# Patient Record
Sex: Female | Born: 1970 | Race: Black or African American | Hispanic: No | Marital: Single | Smoking: Current every day smoker
Health system: Southern US, Community
[De-identification: ages and names within clinical notes are randomized; demographics above are authoritative.]

## PROBLEM LIST (undated history)

## (undated) HISTORY — PX: TUBAL LIGATION: SHX77

## (undated) HISTORY — PX: OTHER SURGICAL HISTORY: SHX169

## (undated) HISTORY — PX: HEEL SPUR SURGERY: SHX665

## (undated) HISTORY — PX: CHOLECYSTECTOMY: SHX55

---

## 2019-08-10 ENCOUNTER — Other Ambulatory Visit: Payer: Self-pay

## 2019-08-10 ENCOUNTER — Ambulatory Visit
Admission: EM | Admit: 2019-08-10 | Discharge: 2019-08-10 | Disposition: A | Discharge status: Home / Self Care | Payer: BC Managed Care – PPO | Attending: Family Medicine | Admitting: Family Medicine

## 2019-08-10 DIAGNOSIS — G44209 Tension-type headache, unspecified, not intractable: Secondary | ICD-10-CM

## 2019-08-10 DIAGNOSIS — G43009 Migraine without aura, not intractable, without status migrainosus: Secondary | ICD-10-CM

## 2019-08-10 MED ORDER — RIZATRIPTAN BENZOATE 5 MG PO TBDP
5.0000 mg | ORAL_TABLET | ORAL | 0 refills | Status: AC | PRN
Start: 2019-08-10 — End: 2019-09-09

## 2019-08-10 MED ORDER — KETOROLAC TROMETHAMINE 60 MG/2ML IM SOLN
60.0000 mg | Freq: Once | INTRAMUSCULAR | Status: AC
  Administered 2019-08-10: 60 mg via INTRAMUSCULAR

## 2019-08-10 MED ORDER — CYCLOBENZAPRINE HCL 10 MG PO TABS: 10.0000 mg | ORAL_TABLET | Freq: Every day | ORAL | 0 refills | Status: AC

## 2019-08-10 NOTE — ED Provider Notes (Signed)
[facility]    CSN: [id number] Arrival date & time: 08/10/19  1710      History   Chief Complaint Chief Complaint  Patient presents with  . Migraine    HPI Meghan Brown is a 49 y.o. female.   49 yo female with a c/o migraine headaches not relieved with over the counter medications. States she has a h/o migraines for years. Has been having frequent headaches since she moved here about one month ago due to stress. States she doesn't have any rizatriptan which she used to take for her migraines. Also c/o upper back pains and tension due to the stress and has not been able sleep much recently. Denies any vision changes, numbness/tingling, fevers, chills.    Migraine    History reviewed. No pertinent past medical history.  There are no problems to display for this patient.   Past Surgical History:  Procedure Laterality Date  . CHOLECYSTECTOMY    . TUBAL LIGATION      OB History   No obstetric history on file.      Home Medications    Prior to Admission medications   Medication Sig Start Date End Date Taking? Authorizing Provider  cyclobenzaprine (FLEXERIL) 10 MG tablet Take 1 tablet (10 mg total) by mouth at bedtime. PRN 08/10/19   Norval Gable, MD  rizatriptan (MAXALT-MLT) 5 MG disintegrating tablet Take 1 tablet (5 mg total) by mouth as needed for migraine. May repeat in 2 hours if needed 08/10/19 09/09/19  Norval Gable, MD    Family History Family History  Problem Relation Age of Onset  . Hypertension Mother   . Heart attack Mother     Social History Social History   Tobacco Use  . Smoking status: Current Every Day Smoker    Packs/day: 0.50    Types: Cigarettes  . Smokeless tobacco: Never Used  Vaping Use  . Vaping Use: Never used  Substance Use Topics  . Alcohol use: Yes    Comment: rarely  . Drug use: Never     Allergies   Patient has no known allergies.   Review of Systems Review of Systems   Physical Exam Triage  Vital Signs ED Triage Vitals  Enc Vitals Group     BP 08/10/19 1741 120/74     Pulse Rate 08/10/19 1741 68     Resp 08/10/19 1741 17     Temp 08/10/19 1741 98.6 F (37 C)     Temp Source 08/10/19 1741 Oral     SpO2 08/10/19 1741 100 %     Weight 08/10/19 1738 176 lb (79.8 kg)     Height 08/10/19 1738 5' 5.5" (1.664 m)     Head Circumference --      Peak Flow --      Pain Score 08/10/19 1737 4     Pain Loc --      Pain Edu? --      Excl. in [place]? --    No data found.  Updated Vital Signs BP 120/74 (BP Location: Right Arm)   Pulse 68   Temp 98.6 F (37 C) (Oral)   Resp 17   Ht 5' 5.5" (1.664 m)   Wt 79.8 kg   LMP 07/19/2019 (Exact Date)   SpO2 100%   BMI 28.84 kg/m   Visual Acuity Right Eye Distance:   Left Eye Distance:   Bilateral Distance:    Right Eye Near:   Left Eye Near:    Bilateral Near:  Physical Exam Vitals and nursing note reviewed.  Constitutional:      General: She is not in acute distress.    Appearance: She is not toxic-appearing or diaphoretic.  Eyes:     Extraocular Movements: Extraocular movements intact.     Pupils: Pupils are equal, round, and reactive to light.  Cardiovascular:     Rate and Rhythm: Normal rate.  Musculoskeletal:     Cervical back: Normal range of motion and neck supple.  Neurological:     General: No focal deficit present.     Mental Status: She is alert and oriented to person, place, and time.     Cranial Nerves: No cranial nerve deficit.     Sensory: No sensory deficit.     Motor: No weakness.     Coordination: Coordination normal.     Gait: Gait normal.     Deep Tendon Reflexes: Reflexes normal.      UC Treatments / Results  Labs (all labs ordered are listed, but only abnormal results are displayed) Labs Reviewed - No data to display  EKG   Radiology No results found.  Procedures Procedures (including critical care time)  Medications Ordered in UC Medications  ketorolac (TORADOL) injection 60  mg (60 mg Intramuscular Given 08/10/19 1815)    Initial Impression / Assessment and Plan / UC Course  I have reviewed the triage vital signs and the nursing notes.  Pertinent labs & imaging results that were available during my care of the patient were reviewed by me and considered in my medical decision making (see chart for details).      Final Clinical Impressions(s) / UC Diagnoses   Final diagnoses:  Migraine without aura and without status migrainosus, not intractable  Muscle tension headache     Discharge Instructions     Follow up with Primary Care provider    ED Prescriptions    Medication Sig Dispense Auth. Provider   rizatriptan (MAXALT-MLT) 5 MG disintegrating tablet Take 1 tablet (5 mg total) by mouth as needed for migraine. May repeat in 2 hours if needed 9 tablet Meryle Pugmire, Pamala Hurry, MD   cyclobenzaprine (FLEXERIL) 10 MG tablet Take 1 tablet (10 mg total) by mouth at bedtime. PRN 30 tablet Payton Mccallum, MD      1. diagnosis reviewed with patient; given toradol 60mg  IM x 1 2. rx as per orders above; reviewed possible side effects, interactions, risks and benefits 3. Establish care and follow up with PCP 4. Follow-up prn if symptoms worsen or don't improve   PDMP not reviewed this encounter.   , MD 08/10/19 1820

## 2019-08-10 NOTE — ED Triage Notes (Signed)
Patient complains of migraines that started happening recently. States that she has noticed her neck muscles tightening, Reports that she is under a new amount stress, recently moving and starting a new job.   Patient states that she has not been sleeping well recently which is causing her to feel irritable.

## 2019-08-10 NOTE — Discharge Instructions (Signed)
Follow up with Primary Care provider 

## 2019-11-24 ENCOUNTER — Ambulatory Visit
Admission: EM | Admit: 2019-11-24 | Discharge: 2019-11-24 | Disposition: A | Discharge status: Home / Self Care | Payer: BC Managed Care – PPO | Attending: Physician Assistant | Admitting: Physician Assistant

## 2019-11-24 ENCOUNTER — Encounter: Payer: Self-pay | Admitting: Emergency Medicine

## 2019-11-24 ENCOUNTER — Other Ambulatory Visit: Payer: Self-pay

## 2019-11-24 DIAGNOSIS — R350 Frequency of micturition: Secondary | ICD-10-CM | POA: Diagnosis present

## 2019-11-24 DIAGNOSIS — B9689 Other specified bacterial agents as the cause of diseases classified elsewhere: Secondary | ICD-10-CM | POA: Diagnosis present

## 2019-11-24 DIAGNOSIS — M545 Low back pain, unspecified: Secondary | ICD-10-CM

## 2019-11-24 DIAGNOSIS — N760 Acute vaginitis: Secondary | ICD-10-CM | POA: Insufficient documentation

## 2019-11-24 LAB — URINALYSIS, COMPLETE (UACMP) WITH MICROSCOPIC
Bacteria, UA: NONE SEEN
Bilirubin Urine: NEGATIVE
Glucose, UA: NEGATIVE mg/dL
Hgb urine dipstick: NEGATIVE
Ketones, ur: NEGATIVE mg/dL
Leukocytes,Ua: NEGATIVE
Nitrite: NEGATIVE
Protein, ur: NEGATIVE mg/dL
RBC / HPF: NONE SEEN RBC/hpf (ref 0–5)
Specific Gravity, Urine: 1.015 (ref 1.005–1.030)
WBC, UA: NONE SEEN WBC/hpf (ref 0–5)
pH: 7 (ref 5.0–8.0)

## 2019-11-24 LAB — WET PREP, GENITAL
Sperm: NONE SEEN
Trich, Wet Prep: NONE SEEN
WBC, Wet Prep HPF POC: NONE SEEN
Yeast Wet Prep HPF POC: NONE SEEN

## 2019-11-24 MED ORDER — HYDROCODONE-ACETAMINOPHEN 5-325 MG PO TABS: 1.0000 | ORAL_TABLET | Freq: Four times a day (QID) | ORAL | 0 refills | Status: AC | PRN

## 2019-11-24 MED ORDER — DICLOFENAC SODIUM 75 MG PO TBEC: 75.0000 mg | DELAYED_RELEASE_TABLET | Freq: Two times a day (BID) | ORAL | 0 refills | Status: AC

## 2019-11-24 MED ORDER — KETOROLAC TROMETHAMINE 60 MG/2ML IM SOLN
60.0000 mg | Freq: Once | INTRAMUSCULAR | Status: AC
  Administered 2019-11-24: 60 mg via INTRAMUSCULAR

## 2019-11-24 MED ORDER — METRONIDAZOLE 500 MG PO TABS
500.0000 mg | ORAL_TABLET | Freq: Two times a day (BID) | ORAL | 0 refills | Status: AC
Start: 2019-11-24 — End: 2019-12-01

## 2019-11-24 MED ORDER — CHLORZOXAZONE 500 MG PO TABS: 500.0000 mg | ORAL_TABLET | Freq: Three times a day (TID) | ORAL | 0 refills | Status: AC | PRN

## 2019-11-24 NOTE — Discharge Instructions (Addendum)
BACK PAIN: Stressed avoiding painful activities . RICE (REST, ICE, COMPRESSION, ELEVATION) guidelines reviewed. May alternate ice and heat. Consider use of muscle rubs, Salonpas patches, etc. Use medications as directed including muscle relaxers if prescribed. Take anti-inflammatory medications as prescribed or OTC NSAIDs/Tylenol.  F/u with PCP in 7-10 days for reexamination, and please feel free to call or return to the urgent care at any time for any questions or concerns you may have and we will be happy to help you!   BACK PAIN RED FLAGS: If the back pain acutely worsens or there are any red flag symptoms such as numbness/tingling, leg weakness, saddle anesthesia, or loss of bowel/bladder control, go immediately to the ER. Follow up with [place] as scheduled or sooner if the pain does not begin to resolve or if it worsens before the follow up    The urinalysis was normal today.  We will send the urine for culture.  We will treat for UTI if urine culture is positive.  Treating at this time for BV infection based on the vaginal swab obtained.  Use pH balance washes and wipes.

## 2019-11-24 NOTE — ED Provider Notes (Signed)
[facility]    CSN: [id number] Arrival date & time: 11/24/19  1155      History   Chief Complaint Chief Complaint  Patient presents with   Back Pain   Urinary Frequency    HPI Meghan Brown is a 49 y.o. female sending for left lower back pain, urinary frequency and dark urine for the past week that has been worsening over the past day.  She says that a month ago she had a virtual visit for urinary frequency and urgency without back pain and was prescribed antibiotic for suspected UTI.  She said that resolved her symptoms.  Patient denies any fever, fatigue, chills, sweats, abdominal pain, pelvic pain, nausea, vomiting, diarrhea, vaginal discharge, hematuria.  Has taken ibuprofen 800 mg without relief of pain.  Denies any injury to back.  He says that bending forward does make the back pain worse.  Denies any radiation pain to the legs.  Denies any numbness, weakness, tingling.  Last menstrual period 2 weeks ago.  No concern for STIs.  No other concerns today.  HPI  History reviewed. No pertinent past medical history.  There are no problems to display for this patient.   Past Surgical History:  Procedure Laterality Date   CHOLECYSTECTOMY     HEEL SPUR SURGERY     thumb surgery     TUBAL LIGATION      OB History   No obstetric history on file.      Home Medications    Prior to Admission medications   Medication Sig Start Date End Date Taking? Authorizing Provider  chlorzoxazone (PARAFON) 500 MG tablet Take 1 tablet (500 mg total) by mouth 3 (three) times daily as needed for up to 7 days for muscle spasms. 11/24/19 12/01/19  Eusebio Friendly B, PA-C  cyclobenzaprine (FLEXERIL) 10 MG tablet Take 1 tablet (10 mg total) by mouth at bedtime. PRN 08/10/19   Payton Mccallum, MD  diclofenac (VOLTAREN) 75 MG EC tablet Take 1 tablet (75 mg total) by mouth 2 (two) times daily for 10 days. 11/24/19 12/04/19  Shirlee Latch, PA-C  HYDROcodone-acetaminophen (NORCO/VICODIN)  5-325 MG tablet Take 1 tablet by mouth every 6 (six) hours as needed for up to 3 days. 11/24/19 11/27/19  Eusebio Friendly B, PA-C  metroNIDAZOLE (FLAGYL) 500 MG tablet Take 1 tablet (500 mg total) by mouth 2 (two) times daily for 7 days. 11/24/19 12/01/19  Shirlee Latch, PA-C    Family History Family History  Problem Relation Age of Onset   Hypertension Mother    Heart attack Mother    Pleurisy Father     Social History Social History   Tobacco Use   Smoking status: Current Every Day Smoker    Packs/day: 0.50    Years: 20.00    Pack years: 10.00    Types: Cigarettes   Smokeless tobacco: Never Used  Vaping Use   Vaping Use: Never used  Substance Use Topics   Alcohol use: Yes    Comment: rarely   Drug use: Never     Allergies   Patient has no known allergies.   Review of Systems Review of Systems  Constitutional: Negative for chills, diaphoresis, fatigue and fever.  Eyes: Negative for redness.  Respiratory: Negative for cough and shortness of breath.   Cardiovascular: Negative for chest pain.  Gastrointestinal: Negative for abdominal pain, nausea and vomiting.  Genitourinary: Positive for frequency, urgency and vaginal discharge. Negative for difficulty urinating, dysuria, flank pain, hematuria, pelvic pain,  vaginal bleeding and vaginal pain.       Dark colored urine  Musculoskeletal: Positive for back pain.  Skin: Negative for rash.  Neurological: Negative for weakness.     Physical Exam Triage Vital Signs ED Triage Vitals  Enc Vitals Group     BP 11/24/19 1257 126/82     Pulse Rate 11/24/19 1257 79     Resp 11/24/19 1257 18     Temp 11/24/19 1257 98.8 F (37.1 C)     Temp Source 11/24/19 1257 Oral     SpO2 11/24/19 1257 100 %     Weight 11/24/19 1257 165 lb (74.8 kg)     Height 11/24/19 1257 5' 5.5" (1.664 m)     Head Circumference --      Peak Flow --      Pain Score 11/24/19 1256 9     Pain Loc --      Pain Edu? --      Excl. in [place]? --    No  data found.  Updated Vital Signs BP 126/82 (BP Location: Left Arm)    Pulse 79    Temp 98.8 F (37.1 C) (Oral)    Resp 18    Ht 5' 5.5" (1.664 m)    Wt 165 lb (74.8 kg)    LMP 11/10/2019 (Approximate)    SpO2 100%    BMI 27.04 kg/m    Physical Exam Vitals and nursing note reviewed.  Constitutional:      General: She is not in acute distress.    Appearance: Normal appearance. She is not ill-appearing or toxic-appearing.  HENT:     Head: Normocephalic and atraumatic.  Eyes:     General: No scleral icterus.       Right eye: No discharge.        Left eye: No discharge.     Conjunctiva/sclera: Conjunctivae normal.  Cardiovascular:     Rate and Rhythm: Normal rate and regular rhythm.     Heart sounds: Normal heart sounds.  Pulmonary:     Effort: Pulmonary effort is normal. No respiratory distress.     Breath sounds: Normal breath sounds.  Abdominal:     Palpations: Abdomen is soft.     Tenderness: There is abdominal tenderness (mild LLQ). There is left CVA tenderness (mild). There is no guarding.  Musculoskeletal:     Cervical back: Neck supple.  Skin:    General: Skin is dry.  Neurological:     General: No focal deficit present.     Mental Status: She is alert. Mental status is at baseline.     Motor: No weakness.     Gait: Gait normal.  Psychiatric:        Mood and Affect: Mood normal.        Behavior: Behavior normal.        Thought Content: Thought content normal.      UC Treatments / Results  Labs (all labs ordered are listed, but only abnormal results are displayed) Labs Reviewed  WET PREP, GENITAL - Abnormal; Notable for the following components:      Result Value   Clue Cells Wet Prep HPF POC PRESENT (*)    All other components within normal limits  URINE CULTURE  URINALYSIS, COMPLETE (UACMP) WITH MICROSCOPIC    EKG   Radiology No results found.  Procedures Procedures (including critical care time)  Medications Ordered in UC Medications    ketorolac (TORADOL) injection 60 mg (60 mg Intramuscular Given  11/24/19 1411)    Initial Impression / Assessment and Plan / UC Course  I have reviewed the triage vital signs and the nursing notes.  Pertinent labs & imaging results that were available during my care of the patient were reviewed by me and considered in my medical decision making (see chart for details).   UA within normal limits.  Wet prep shows positive clue cells.  Treating at this time for bacterial vaginosis infection with metronidazole.  Patient is back pain seems more musculoskeletal so treating that at this time with diclofenac, chlorzoxazone, and hydrocodone as needed for breakthrough pain.  Urine sent for culture and will treat for UTI if positive.  ED precautions discussed with patient.   Final Clinical Impressions(s) / UC Diagnoses   Final diagnoses:  Acute right-sided low back pain without sciatica  Bacterial vaginosis  Urinary frequency     Discharge Instructions     BACK PAIN: Stressed avoiding painful activities . RICE (REST, ICE, COMPRESSION, ELEVATION) guidelines reviewed. May alternate ice and heat. Consider use of muscle rubs, Salonpas patches, etc. Use medications as directed including muscle relaxers if prescribed. Take anti-inflammatory medications as prescribed or OTC NSAIDs/Tylenol.  F/u with PCP in 7-10 days for reexamination, and please feel free to call or return to the urgent care at any time for any questions or concerns you may have and we will be happy to help you!   BACK PAIN RED FLAGS: If the back pain acutely worsens or there are any red flag symptoms such as numbness/tingling, leg weakness, saddle anesthesia, or loss of bowel/bladder control, go immediately to the ER. Follow up with [place] as scheduled or sooner if the pain does not begin to resolve or if it worsens before the follow up    The urinalysis was normal today.  We will send the urine for culture.  We will treat for UTI if urine  culture is positive.  Treating at this time for BV infection based on the vaginal swab obtained.  Use pH balance washes and wipes.    ED Prescriptions    Medication Sig Dispense Auth. Provider   metroNIDAZOLE (FLAGYL) 500 MG tablet Take 1 tablet (500 mg total) by mouth 2 (two) times daily for 7 days. 14 tablet Eusebio Friendly B, PA-C   chlorzoxazone (PARAFON) 500 MG tablet Take 1 tablet (500 mg total) by mouth 3 (three) times daily as needed for up to 7 days for muscle spasms. 21 tablet Eusebio Friendly B, PA-C   diclofenac (VOLTAREN) 75 MG EC tablet Take 1 tablet (75 mg total) by mouth 2 (two) times daily for 10 days. 20 tablet Shirlee Latch, PA-C   HYDROcodone-acetaminophen (NORCO/VICODIN) 5-325 MG tablet Take 1 tablet by mouth every 6 (six) hours as needed for up to 3 days. 8 tablet Shirlee Latch, PA-C     I have reviewed the PDMP during this encounter.   Shirlee Latch, PA-C 11/25/19 1416

## 2019-11-24 NOTE — ED Triage Notes (Signed)
Patient in today c/o low back pain, urinary frequency and dark urine x >1 week. Patient did a virtual visit ~1 month ago and was prescribed antibiotics. Patient did complete the antibiotics and symptoms did resolve.

## 2019-11-26 ENCOUNTER — Telehealth (HOSPITAL_COMMUNITY): Payer: Self-pay | Admitting: Emergency Medicine

## 2019-11-26 LAB — URINE CULTURE: Culture: 80000 — AB

## 2019-11-26 MED ORDER — SULFAMETHOXAZOLE-TRIMETHOPRIM 800-160 MG PO TABS
1.0000 | ORAL_TABLET | Freq: Two times a day (BID) | ORAL | 0 refills | Status: AC
Start: 2019-11-26 — End: 2019-12-03

## 2020-02-24 ENCOUNTER — Other Ambulatory Visit: Payer: Self-pay

## 2020-02-24 ENCOUNTER — Ambulatory Visit (INDEPENDENT_AMBULATORY_CARE_PROVIDER_SITE_OTHER): Payer: BC Managed Care – PPO

## 2020-02-24 ENCOUNTER — Ambulatory Visit
Admission: EM | Admit: 2020-02-24 | Discharge: 2020-02-24 | Disposition: A | Discharge status: Home / Self Care | Payer: BC Managed Care – PPO | Attending: Family Medicine | Admitting: Family Medicine

## 2020-02-24 DIAGNOSIS — R0989 Other specified symptoms and signs involving the circulatory and respiratory systems: Secondary | ICD-10-CM | POA: Diagnosis not present

## 2020-02-24 DIAGNOSIS — R0602 Shortness of breath: Secondary | ICD-10-CM | POA: Diagnosis present

## 2020-02-24 DIAGNOSIS — U071 COVID-19: Secondary | ICD-10-CM

## 2020-02-24 DIAGNOSIS — R059 Cough, unspecified: Secondary | ICD-10-CM | POA: Diagnosis present

## 2020-02-24 LAB — RESP PANEL BY RT-PCR (FLU A&B, COVID) ARPGX2
Influenza A by PCR: NEGATIVE
Influenza B by PCR: NEGATIVE
SARS Coronavirus 2 by RT PCR: POSITIVE — AB

## 2020-02-24 MED ORDER — ALBUTEROL SULFATE HFA 108 (90 BASE) MCG/ACT IN AERS: 1.0000 | INHALATION_SPRAY | Freq: Four times a day (QID) | RESPIRATORY_TRACT | 0 refills | Status: AC | PRN

## 2020-02-24 MED ORDER — BENZONATATE 100 MG PO CAPS: 200.0000 mg | ORAL_CAPSULE | Freq: Three times a day (TID) | ORAL | 0 refills | Status: AC | PRN

## 2020-02-24 NOTE — ED Provider Notes (Signed)
[facility]    CSN: [id number] Arrival date & time: 02/24/20  1218      History   Chief Complaint Chief Complaint  Patient presents with   Cough    HPI Meghan Brown is a 49 y.o. female presenting for 2 day history of cough and chest congestion. Patient says symptoms feel similar to when she had pneumonia in the past.  Patient denies fever, said she felt hot.  She denies any sore throat.  Has had some headaches.  Denies any significant fatigue.  She states her chest hurts sometimes when she coughs and feels short of breath.  He says that she feels more short of breath when she is walking.  Patient says her daughter has similar symptoms.  Patient denies any known COVID-19 exposure. Patient not vaccinated for COVID 19. Has been drinking warm teas with honey, but no OTC meds.  Patient without any significant medical problems.  No other complaints or concerns.  HPI  History reviewed. No pertinent past medical history.  There are no problems to display for this patient.   Past Surgical History:  Procedure Laterality Date   CHOLECYSTECTOMY     HEEL SPUR SURGERY     thumb surgery     TUBAL LIGATION      OB History   No obstetric history on file.      Home Medications    Prior to Admission medications   Medication Sig Start Date End Date Taking? Authorizing Provider  albuterol (VENTOLIN HFA) 108 (90 Base) MCG/ACT inhaler Inhale 1-2 puffs into the lungs every 6 (six) hours as needed for wheezing or shortness of breath. 02/24/20 02/23/21 Yes Shirlee Latch, PA-C  benzonatate (TESSALON) 100 MG capsule Take 2 capsules (200 mg total) by mouth 3 (three) times daily as needed for cough. 02/24/20  Yes Eusebio Friendly B, PA-C  cyclobenzaprine (FLEXERIL) 10 MG tablet Take 1 tablet (10 mg total) by mouth at bedtime. PRN 08/10/19   Payton Mccallum, MD    Family History Family History  Problem Relation Age of Onset   Hypertension Mother    Heart attack Mother     Pleurisy Father     Social History Social History   Tobacco Use   Smoking status: Current Every Day Smoker    Packs/day: 0.50    Years: 20.00    Pack years: 10.00    Types: Cigarettes   Smokeless tobacco: Never Used  Building services engineer Use: Never used  Substance Use Topics   Alcohol use: Yes    Comment: rarely   Drug use: Never     Allergies   Patient has no known allergies.   Review of Systems Review of Systems  Constitutional: Negative for chills, diaphoresis, fatigue and fever.  HENT: Positive for congestion and rhinorrhea. Negative for ear pain, sinus pressure, sinus pain and sore throat.   Respiratory: Positive for cough and shortness of breath.   Cardiovascular: Positive for chest pain.  Gastrointestinal: Negative for abdominal pain, nausea and vomiting.  Musculoskeletal: Positive for myalgias. Negative for arthralgias.  Skin: Negative for rash.  Neurological: Negative for weakness and headaches.  Hematological: Negative for adenopathy.     Physical Exam Triage Vital Signs ED Triage Vitals  Enc Vitals Group     BP 02/24/20 1531 105/77     Pulse Rate 02/24/20 1531 74     Resp 02/24/20 1531 18     Temp 02/24/20 1531 98.2 F (36.8 C)  Temp Source 02/24/20 1531 Oral     SpO2 02/24/20 1531 100 %     Weight 02/24/20 1532 170 lb (77.1 kg)     Height 02/24/20 1532 5' 5.5" (1.664 m)     Head Circumference --      Peak Flow --      Pain Score 02/24/20 1512 2     Pain Loc --      Pain Edu? --      Excl. in GC? --    No data found.  Updated Vital Signs BP 105/77 (BP Location: Left Arm)    Pulse 74    Temp 98.2 F (36.8 C) (Oral)    Resp 18    Ht 5' 5.5" (1.664 m)    Wt 170 lb (77.1 kg)    LMP 02/01/2020    SpO2 100%    BMI 27.86 kg/m       Physical Exam Vitals and nursing note reviewed.  Constitutional:      General: She is not in acute distress.    Appearance: Normal appearance. She is not ill-appearing or toxic-appearing.  HENT:      Head: Normocephalic and atraumatic.     Nose: Congestion and rhinorrhea present.     Mouth/Throat:     Mouth: Mucous membranes are moist.     Pharynx: Oropharynx is clear.  Eyes:     General: No scleral icterus.       Right eye: No discharge.        Left eye: No discharge.     Conjunctiva/sclera: Conjunctivae normal.  Cardiovascular:     Rate and Rhythm: Normal rate and regular rhythm.     Heart sounds: Normal heart sounds.  Pulmonary:     Effort: Pulmonary effort is normal. No respiratory distress.     Breath sounds: Normal breath sounds. No wheezing, rhonchi or rales.  Musculoskeletal:     Cervical back: Neck supple.  Skin:    General: Skin is dry.  Neurological:     General: No focal deficit present.     Mental Status: She is alert. Mental status is at baseline.     Motor: No weakness.     Gait: Gait normal.  Psychiatric:        Mood and Affect: Mood normal.        Behavior: Behavior normal.        Thought Content: Thought content normal.      UC Treatments / Results  Labs (all labs ordered are listed, but only abnormal results are displayed) Labs Reviewed  RESP PANEL BY RT-PCR (FLU A&B, COVID) ARPGX2 - Abnormal; Notable for the following components:      Result Value   SARS Coronavirus 2 by RT PCR POSITIVE (*)    All other components within normal limits    EKG   Radiology DG Chest 2 View  Result Date: 02/24/2020 CLINICAL DATA:  Cough, chest congestion for 2 days, COVID-19 positive today EXAM: CHEST - 2 VIEW COMPARISON:  None. FINDINGS: The heart size and mediastinal contours are within normal limits. Both lungs are clear. The visualized skeletal structures are unremarkable. IMPRESSION: No active cardiopulmonary disease. Electronically Signed   By: Sharlet Salina M.D.   On: 02/24/2020 16:30    Procedures Procedures (including critical care time)  Medications Ordered in UC Medications - No data to display  Initial Impression / Assessment and Plan / UC  Course  I have reviewed the triage vital signs and the nursing notes.  Pertinent labs & imaging results that were available during my care of the patient were reviewed by me and considered in my medical decision making (see chart for details).   49 year old female presenting for cough, congestion, body aches, chest discomfort and breathing difficulty over the past 2 days.  Patient is unvaccinated.  All vital signs are normal and stable and patient is in no acute distress.  She has mild nasal congestion rhinorrhea on exam.  Chest is clear to auscultation heart regular rate and rhythm.  Respiratory panel obtained today.  Chest x-ray obtained due to patient concerns about possible pneumonia even though I advised her that it is unlikely since she does not have a fever and her oxygen is normal.  Also her chest is clear to auscultation.  Chest x-ray which was independently read by me is within normal limits.  Respiratory panel positive for COVID-19.  Discussed results with patient.  CDC guidelines, isolation protocol and ED precautions reviewed with patient.  Advised supportive care with increasing rest and fluids.  Sent LawyerTessalon Perles to pharmacy.  Contacted infusion center for possible MAB therapy since patient is unvaccinated.  Final Clinical Impressions(s) / UC Diagnoses   Final diagnoses:  COVID-19  Cough  Shortness of breath     Discharge Instructions     +COVID test today. 8 more days of isolation.   If symptomatic, go home and rest. Push fluids. Take Tylenol as needed for discomfort. Gargle warm salt water. Throat lozenges. Take Mucinex DM or Robitussin for cough. Humidifier in bedroom to ease coughing. Warm showers. Also review the COVID handout for more information.  COVID-19 INFECTION: The incubation period of COVID-19 is approximately 14 days after exposure, with most symptoms developing in roughly 4-5 days. Symptoms may range in severity from mild to critically severe. Roughly  80% of those infected will have mild symptoms. People of any age may become infected with COVID-19 and have the ability to transmit the virus. The most common symptoms include: fever, fatigue, cough, body aches, headaches, sore throat, nasal congestion, shortness of breath, nausea, vomiting, diarrhea, changes in smell and/or taste.    COURSE OF ILLNESS Some patients may begin with mild disease which can progress quickly into critical symptoms. If your symptoms are worsening please call ahead to the Emergency Department and proceed there for further treatment. Recovery time appears to be roughly 1-2 weeks for mild symptoms and 3-6 weeks for severe disease.   GO IMMEDIATELY TO ER FOR FEVER YOU ARE UNABLE TO GET DOWN WITH TYLENOL, BREATHING PROBLEMS, CHEST PAIN, FATIGUE, LETHARGY, INABILITY TO EAT OR DRINK, ETC  QUARANTINE AND ISOLATION: To help decrease the spread of COVID-19 please remain isolated if you have COVID infection or are highly suspected to have COVID infection. This means -stay home and isolate to one room in the home if you live with others. Do not share a bed or bathroom with others while ill, sanitize and wipe down all countertops and keep common areas clean and disinfected. You may discontinue isolation if you have a mild case and are asymptomatic 10 days after symptom onset as long as you have been fever free >24 hours without having to take Motrin or Tylenol. If your case is more severe (meaning you develop pneumonia or are admitted in the hospital), you may have to isolate longer.   If you have been in close contact (within 6 feet) of someone diagnosed with COVID 19, you are advised to quarantine in your home for 14 days as  symptoms can develop anywhere from 2-14 days after exposure to the virus. If you develop symptoms, you  must isolate.  Most current guidelines for COVID after exposure -isolate 10 days if you ARE NOT tested for COVID as long as symptoms do not develop -isolate 7 days  if you are tested and remain asymptomatic -You do not necessarily need to be tested for COVID if you have + exposure and        develop   symptoms. Just isolate at home x10 days from symptom onset During this global pandemic, CDC advises to practice social distancing, try to stay at least 17ft away from others at all times. Wear a face covering. Wash and sanitize your hands regularly and avoid going anywhere that is not necessary.  KEEP IN MIND THAT THE COVID TEST IS NOT 100% ACCURATE AND YOU SHOULD STILL DO EVERYTHING TO PREVENT POTENTIAL SPREAD OF VIRUS TO OTHERS (WEAR MASK, WEAR GLOVES, WASH HANDS AND SANITIZE REGULARLY). IF INITIAL TEST IS NEGATIVE, THIS MAY NOT MEAN YOU ARE DEFINITELY NEGATIVE. MOST ACCURATE TESTING IS DONE 5-7 DAYS AFTER EXPOSURE.   It is not advised by CDC to get re-tested after receiving a positive COVID test since you can still test positive for weeks to months after you have already cleared the virus.   *If you have not been vaccinated for COVID, I strongly suggest you consider getting vaccinated as long as there are no contraindications.      ED Prescriptions    Medication Sig Dispense Auth. Provider   benzonatate (TESSALON) 100 MG capsule Take 2 capsules (200 mg total) by mouth 3 (three) times daily as needed for cough. 21 capsule Eusebio Friendly B, PA-C   albuterol (VENTOLIN HFA) 108 (90 Base) MCG/ACT inhaler Inhale 1-2 puffs into the lungs every 6 (six) hours as needed for wheezing or shortness of breath. 1 each Gareth Morgan     PDMP not reviewed this encounter.   Shirlee Latch, PA-C 02/24/20 1709

## 2020-02-24 NOTE — ED Triage Notes (Signed)
Cough, chest congestion x 2 days. Afebrile but feels hot.  Just hot tea and lemon to treat cough.  Had COVID test 12/24 that was negative.

## 2020-02-24 NOTE — Discharge Instructions (Addendum)
+COVID test today. 8 more days of isolation.   If symptomatic, go home and rest. Push fluids. Take Tylenol as needed for discomfort. Gargle warm salt water. Throat lozenges. Take Mucinex DM or Robitussin for cough. Humidifier in bedroom to ease coughing. Warm showers. Also review the COVID handout for more information.  COVID-19 INFECTION: The incubation period of COVID-19 is approximately 14 days after exposure, with most symptoms developing in roughly 4-5 days. Symptoms may range in severity from mild to critically severe. Roughly 80% of those infected will have mild symptoms. People of any age may become infected with COVID-19 and have the ability to transmit the virus. The most common symptoms include: fever, fatigue, cough, body aches, headaches, sore throat, nasal congestion, shortness of breath, nausea, vomiting, diarrhea, changes in smell and/or taste.    COURSE OF ILLNESS Some patients may begin with mild disease which can progress quickly into critical symptoms. If your symptoms are worsening please call ahead to the Emergency Department and proceed there for further treatment. Recovery time appears to be roughly 1-2 weeks for mild symptoms and 3-6 weeks for severe disease.   GO IMMEDIATELY TO ER FOR FEVER YOU ARE UNABLE TO GET DOWN WITH TYLENOL, BREATHING PROBLEMS, CHEST PAIN, FATIGUE, LETHARGY, INABILITY TO EAT OR DRINK, ETC  QUARANTINE AND ISOLATION: To help decrease the spread of COVID-19 please remain isolated if you have COVID infection or are highly suspected to have COVID infection. This means -stay home and isolate to one room in the home if you live with others. Do not share a bed or bathroom with others while ill, sanitize and wipe down all countertops and keep common areas clean and disinfected. You may discontinue isolation if you have a mild case and are asymptomatic 10 days after symptom onset as long as you have been fever free >24 hours without having to take Motrin or Tylenol.  If your case is more severe (meaning you develop pneumonia or are admitted in the hospital), you may have to isolate longer.   If you have been in close contact (within 6 feet) of someone diagnosed with COVID 19, you are advised to quarantine in your home for 14 days as symptoms can develop anywhere from 2-14 days after exposure to the virus. If you develop symptoms, you  must isolate.  Most current guidelines for COVID after exposure -isolate 10 days if you ARE NOT tested for COVID as long as symptoms do not develop -isolate 7 days if you are tested and remain asymptomatic -You do not necessarily need to be tested for COVID if you have + exposure and        develop   symptoms. Just isolate at home x10 days from symptom onset During this global pandemic, CDC advises to practice social distancing, try to stay at least 34ft away from others at all times. Wear a face covering. Wash and sanitize your hands regularly and avoid going anywhere that is not necessary.  KEEP IN MIND THAT THE COVID TEST IS NOT 100% ACCURATE AND YOU SHOULD STILL DO EVERYTHING TO PREVENT POTENTIAL SPREAD OF VIRUS TO OTHERS (WEAR MASK, WEAR GLOVES, WASH HANDS AND SANITIZE REGULARLY). IF INITIAL TEST IS NEGATIVE, THIS MAY NOT MEAN YOU ARE DEFINITELY NEGATIVE. MOST ACCURATE TESTING IS DONE 5-7 DAYS AFTER EXPOSURE.   It is not advised by CDC to get re-tested after receiving a positive COVID test since you can still test positive for weeks to months after you have already cleared the virus.   *  If you have not been vaccinated for COVID, I strongly suggest you consider getting vaccinated as long as there are no contraindications.

## 2020-05-16 ENCOUNTER — Ambulatory Visit
Admission: EM | Admit: 2020-05-16 | Discharge: 2020-05-16 | Disposition: A | Discharge status: Home / Self Care | Payer: BC Managed Care – PPO

## 2020-05-16 ENCOUNTER — Other Ambulatory Visit: Payer: Self-pay

## 2020-05-16 DIAGNOSIS — Z1152 Encounter for screening for COVID-19: Secondary | ICD-10-CM

## 2020-05-16 NOTE — Discharge Instructions (Signed)

## 2022-03-13 IMAGING — CR DG CHEST 2V
2 series · 2 of 2 positions shown · non-contrast
Comparison: None.

CLINICAL DATA: Cough, chest congestion for 2 days, [id number]
positive today

EXAM:
CHEST - 2 VIEW

[chest pa]
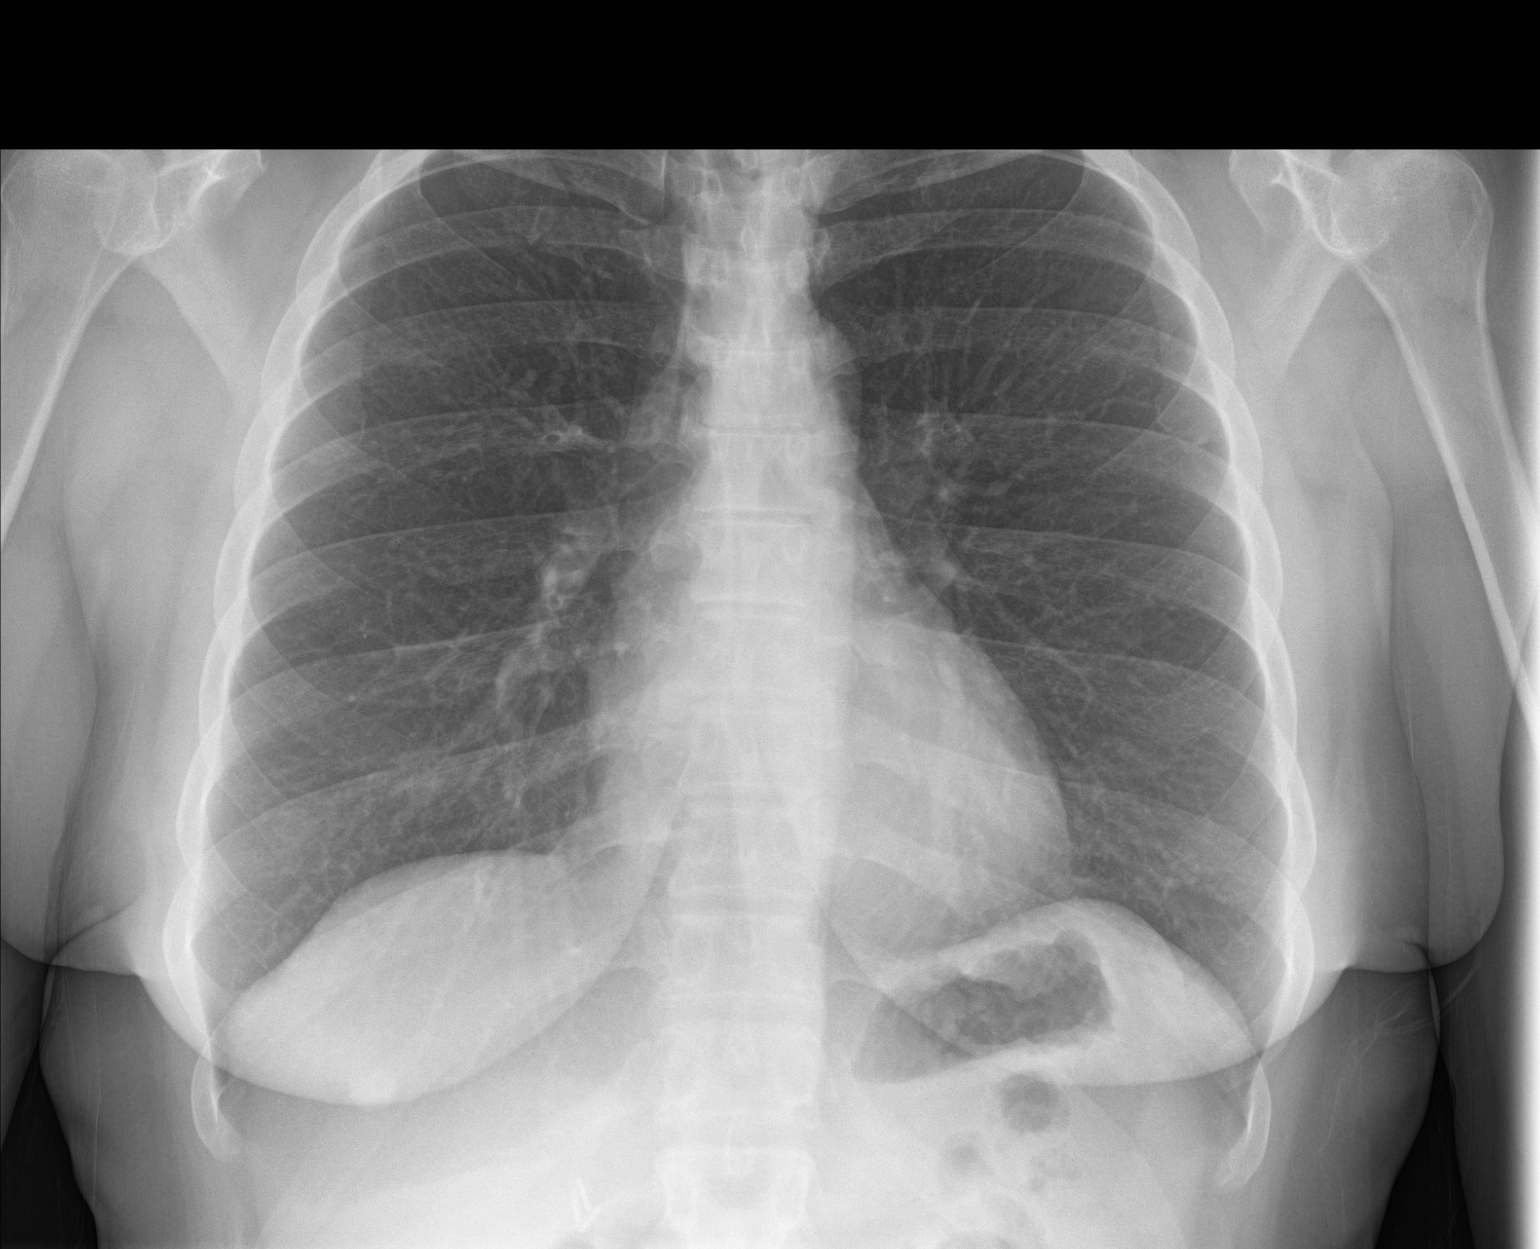

[chest lat]
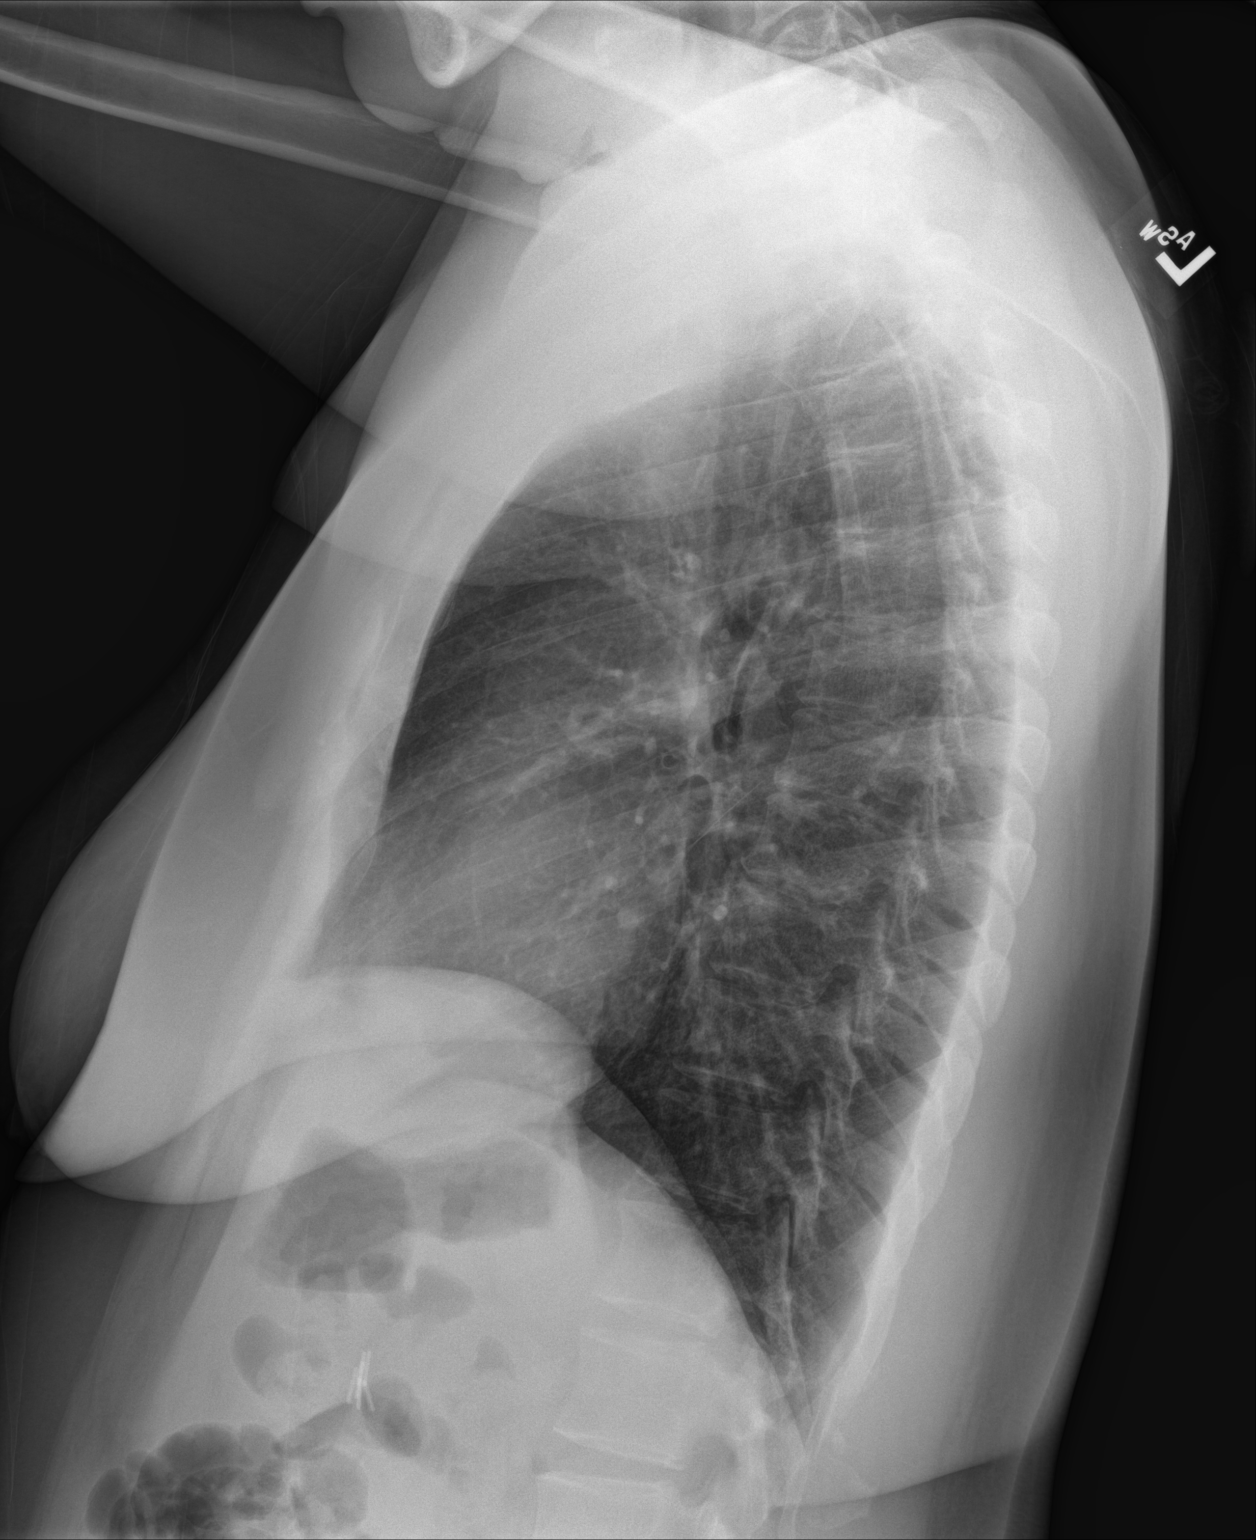

[2 of 2 positions shown; findings below may reference images not displayed]

FINDINGS: The heart size and mediastinal contours are within normal limits.
Both lungs are clear. The visualized skeletal structures are
unremarkable.
IMPRESSION: No active cardiopulmonary disease.
